# Patient Record
Sex: Female | Born: 1935 | Race: White | Hispanic: No | Smoking: Never smoker
Health system: Southern US, Community
[De-identification: ages and names within clinical notes are randomized; demographics above are authoritative.]

## PROBLEM LIST (undated history)

## (undated) DIAGNOSIS — I2699 Other pulmonary embolism without acute cor pulmonale: Secondary | ICD-10-CM

## (undated) DIAGNOSIS — M1990 Unspecified osteoarthritis, unspecified site: Secondary | ICD-10-CM

## (undated) DIAGNOSIS — K469 Unspecified abdominal hernia without obstruction or gangrene: Secondary | ICD-10-CM

## (undated) DIAGNOSIS — Z853 Personal history of malignant neoplasm of breast: Secondary | ICD-10-CM

## (undated) DIAGNOSIS — I499 Cardiac arrhythmia, unspecified: Secondary | ICD-10-CM

## (undated) DIAGNOSIS — J45909 Unspecified asthma, uncomplicated: Secondary | ICD-10-CM

## (undated) DIAGNOSIS — R011 Cardiac murmur, unspecified: Secondary | ICD-10-CM

## (undated) DIAGNOSIS — I272 Pulmonary hypertension, unspecified: Secondary | ICD-10-CM

## (undated) DIAGNOSIS — I350 Nonrheumatic aortic (valve) stenosis: Secondary | ICD-10-CM

## (undated) DIAGNOSIS — I519 Heart disease, unspecified: Secondary | ICD-10-CM

## (undated) DIAGNOSIS — I454 Nonspecific intraventricular block: Secondary | ICD-10-CM

## (undated) DIAGNOSIS — C50919 Malignant neoplasm of unspecified site of unspecified female breast: Secondary | ICD-10-CM

## (undated) DIAGNOSIS — I10 Essential (primary) hypertension: Secondary | ICD-10-CM

## (undated) HISTORY — DX: Heart disease, unspecified: I51.9

## (undated) HISTORY — DX: Unspecified abdominal hernia without obstruction or gangrene: K46.9

## (undated) HISTORY — DX: Cardiac arrhythmia, unspecified: I49.9

## (undated) HISTORY — DX: Essential (primary) hypertension: I10

## (undated) HISTORY — PX: SPINE SURGERY: SHX786

## (undated) HISTORY — PX: ABDOMINAL HYSTERECTOMY: SHX81

## (undated) HISTORY — DX: Unspecified asthma, uncomplicated: J45.909

## (undated) HISTORY — PX: BREAST BIOPSY: SHX20

## (undated) HISTORY — PX: TONSILLECTOMY: SUR1361

## (undated) HISTORY — PX: HERNIA REPAIR: SHX51

## (undated) HISTORY — PX: DILATION AND CURETTAGE OF UTERUS: SHX78

## (undated) HISTORY — PX: RECTAL SURGERY: SHX760

## (undated) HISTORY — PX: STOMACH SURGERY: SHX791

## (undated) HISTORY — DX: Malignant neoplasm of unspecified site of unspecified female breast: C50.919

## (undated) HISTORY — DX: Other pulmonary embolism without acute cor pulmonale: I26.99

## (undated) HISTORY — DX: Cardiac murmur, unspecified: R01.1

## (undated) HISTORY — DX: Personal history of malignant neoplasm of breast: Z85.3

## (undated) HISTORY — DX: Nonrheumatic aortic (valve) stenosis: I35.0

## (undated) HISTORY — PX: BLADDER SURGERY: SHX569

## (undated) HISTORY — PX: COLONOSCOPY: SHX174

## (undated) HISTORY — PX: ELBOW SURGERY: SHX618

## (undated) HISTORY — PX: OVARY SURGERY: SHX727

## (undated) HISTORY — DX: Pulmonary hypertension, unspecified: I27.20

## (undated) HISTORY — DX: Unspecified osteoarthritis, unspecified site: M19.90

## (undated) HISTORY — DX: Nonspecific intraventricular block: I45.4

---

## 2002-11-12 HISTORY — PX: BREAST SURGERY: SHX581

## 2004-08-12 ENCOUNTER — Ambulatory Visit: Payer: Self-pay | Admitting: Oncology

## 2004-08-21 ENCOUNTER — Ambulatory Visit: Payer: Self-pay | Admitting: Rheumatology

## 2004-11-20 ENCOUNTER — Ambulatory Visit: Payer: Self-pay | Admitting: General Surgery

## 2004-12-08 ENCOUNTER — Ambulatory Visit: Payer: Self-pay | Admitting: Oncology

## 2004-12-13 ENCOUNTER — Ambulatory Visit: Payer: Self-pay | Admitting: Oncology

## 2005-02-08 ENCOUNTER — Ambulatory Visit: Payer: Self-pay | Admitting: Oncology

## 2005-04-11 ENCOUNTER — Ambulatory Visit: Payer: Self-pay | Admitting: Oncology

## 2005-09-26 ENCOUNTER — Ambulatory Visit: Payer: Self-pay | Admitting: Oncology

## 2006-01-14 ENCOUNTER — Ambulatory Visit: Payer: Self-pay | Admitting: General Surgery

## 2006-03-25 ENCOUNTER — Ambulatory Visit: Payer: Self-pay | Admitting: Oncology

## 2006-04-12 ENCOUNTER — Ambulatory Visit: Payer: Self-pay | Admitting: Oncology

## 2006-09-23 ENCOUNTER — Ambulatory Visit: Payer: Self-pay | Admitting: Oncology

## 2007-03-24 ENCOUNTER — Ambulatory Visit: Payer: Self-pay | Admitting: Oncology

## 2007-04-13 ENCOUNTER — Ambulatory Visit: Payer: Self-pay | Admitting: Oncology

## 2007-04-15 ENCOUNTER — Ambulatory Visit: Payer: Self-pay | Admitting: General Surgery

## 2007-10-23 ENCOUNTER — Ambulatory Visit: Payer: Self-pay | Admitting: Oncology

## 2007-11-13 ENCOUNTER — Ambulatory Visit: Payer: Self-pay | Admitting: Oncology

## 2007-11-13 HISTORY — PX: JOINT REPLACEMENT: SHX530

## 2007-11-13 HISTORY — PX: KNEE SURGERY: SHX244

## 2007-12-14 ENCOUNTER — Ambulatory Visit: Payer: Self-pay | Admitting: Oncology

## 2008-04-12 ENCOUNTER — Ambulatory Visit: Payer: Self-pay | Admitting: General Surgery

## 2008-04-12 ENCOUNTER — Ambulatory Visit: Payer: Self-pay | Admitting: Oncology

## 2008-04-29 ENCOUNTER — Ambulatory Visit: Payer: Self-pay | Admitting: Oncology

## 2008-05-12 ENCOUNTER — Ambulatory Visit: Payer: Self-pay | Admitting: Oncology

## 2008-06-12 ENCOUNTER — Ambulatory Visit: Payer: Self-pay | Admitting: Oncology

## 2008-06-16 ENCOUNTER — Ambulatory Visit: Payer: Self-pay | Admitting: Oncology

## 2008-07-13 ENCOUNTER — Ambulatory Visit: Payer: Self-pay | Admitting: Oncology

## 2008-08-14 IMAGING — CT CT CHEST W/O CM
1 series · 16 of 33 positions shown, 20 images · non-contrast
Comparison: No comparison

REASON FOR EXAM: lung nodules   HX of breast CA
COMMENTS:

PROCEDURE:     CT  - CT CHEST WITHOUT CONTRAST  - June 16, 2008 [DATE]
RESULT:     CT Chest
INDICATION: Lung nodules. History of breast cancer.
TECHNIQUE: Multiple axial images of the chest are obtained without
intravenous contrast.

[Series 2: soft tissue · axial · 0.75mm/px · z∈[-434,-139]mm · 16 of 65 slices shown, 20 images]
[im 3/65  mediastinal]
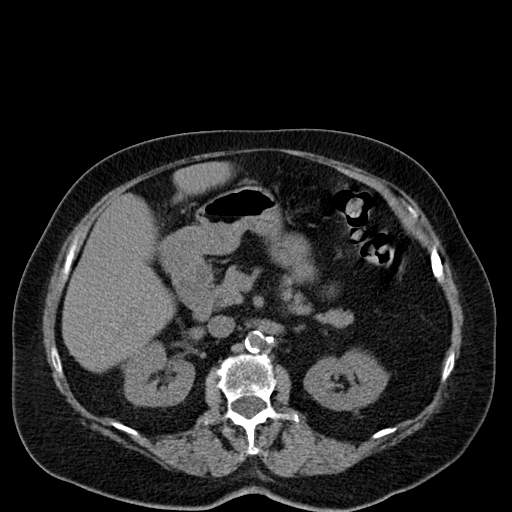
[im 3/65  lung]
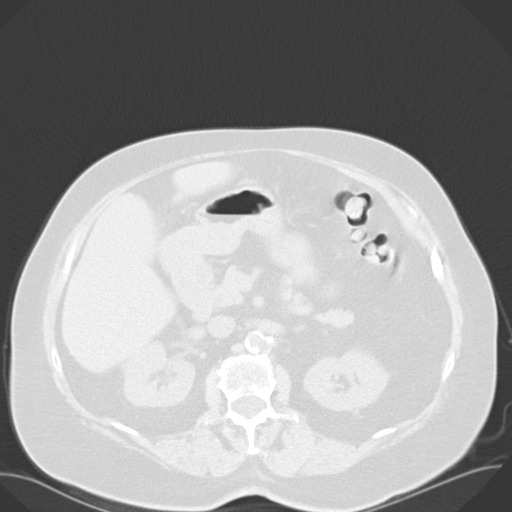
[im 8/65  lung]
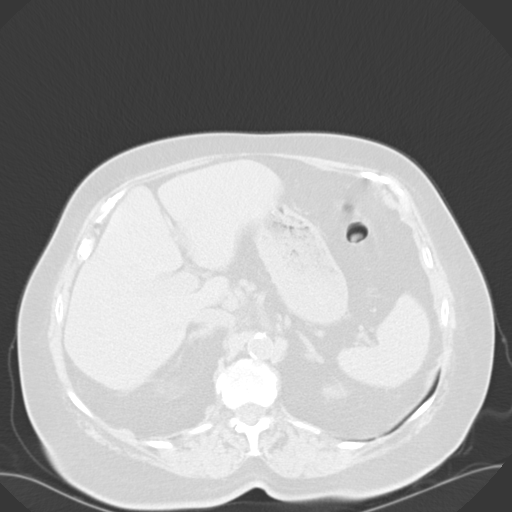
[im 12/65  lung]
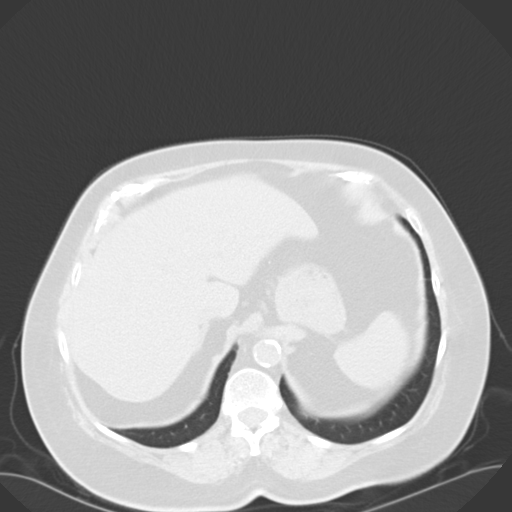
[im 15/65  lung]
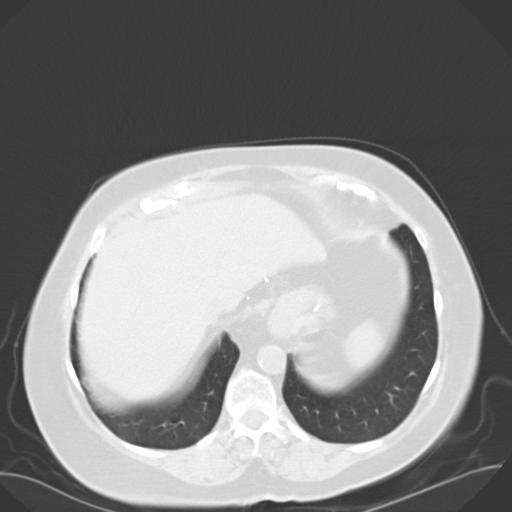
[im 19/65  mediastinal]
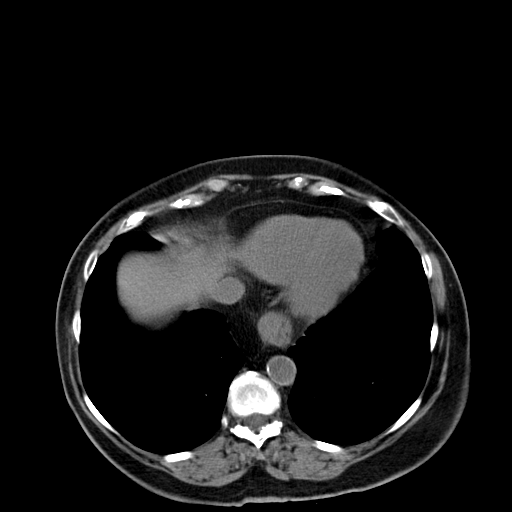
[im 19/65  lung]
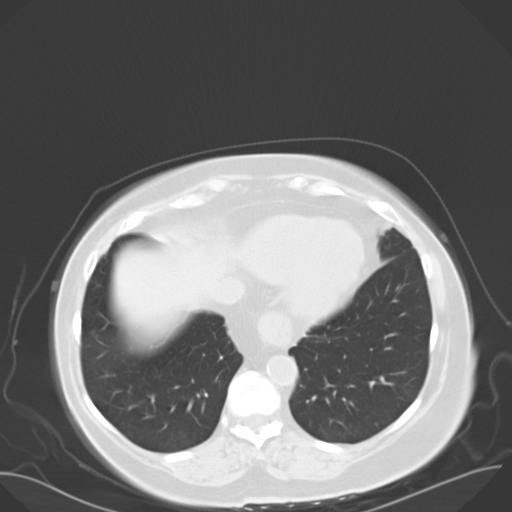
[im 24/65  lung]
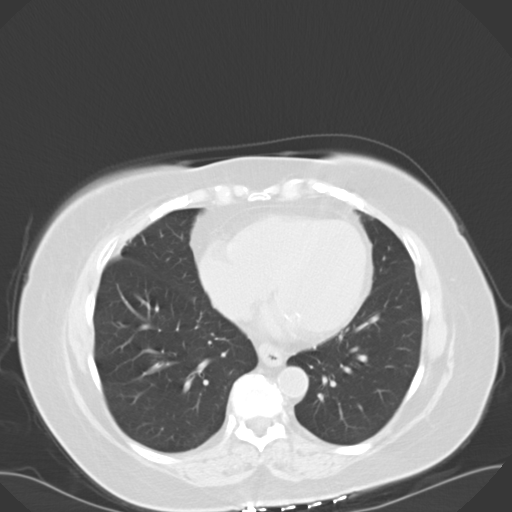
[im 27/65  lung]
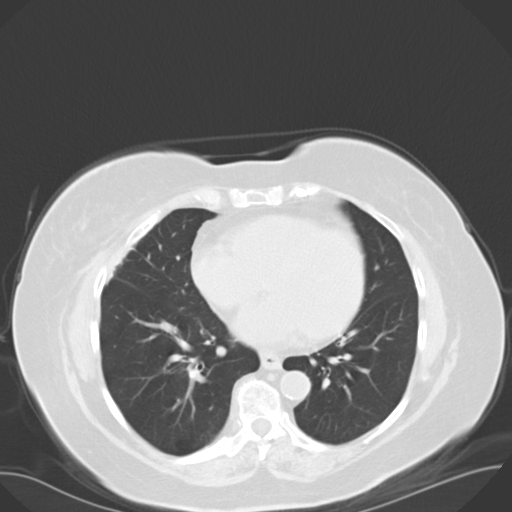
[im 31/65  lung]
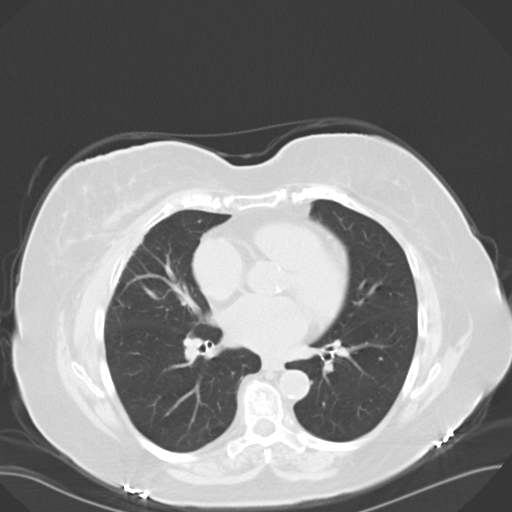
[im 35/65  mediastinal]
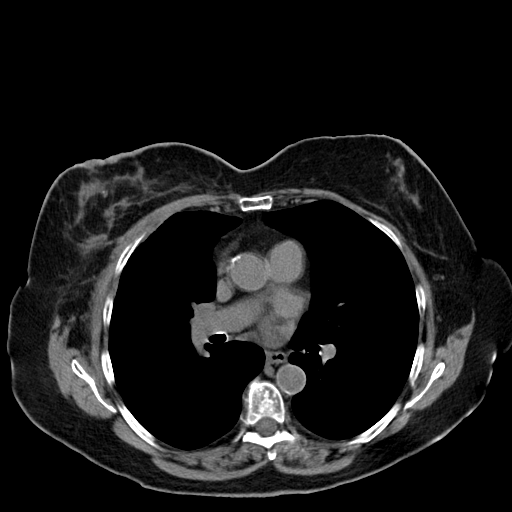
[im 35/65  lung]
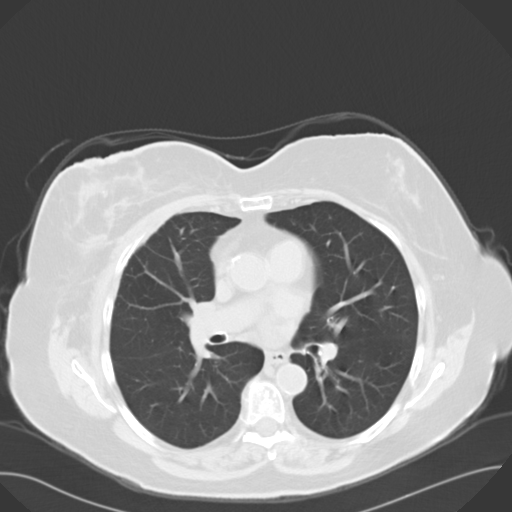
[im 38/65  lung]
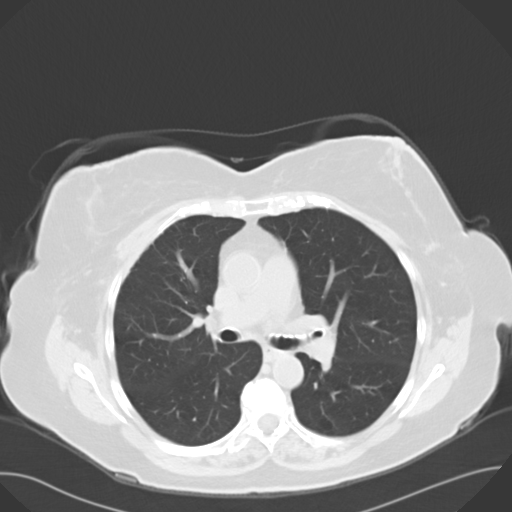
[im 41/65  lung]
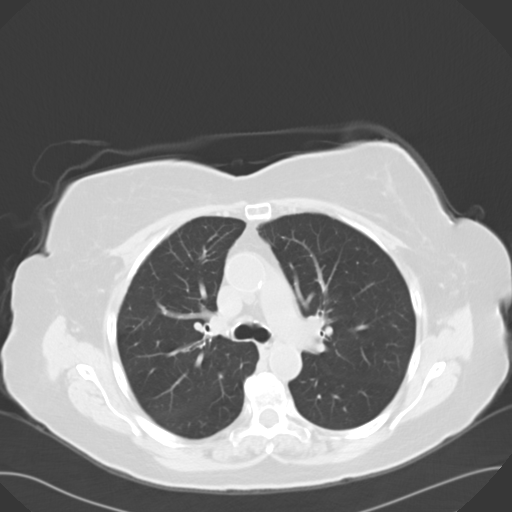
[im 46/65  lung]
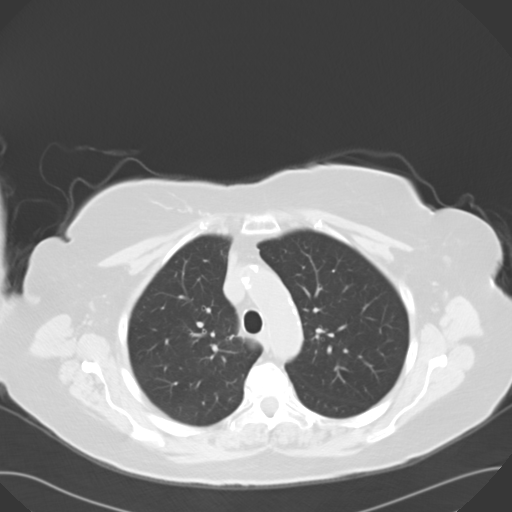
[im 50/65  mediastinal]
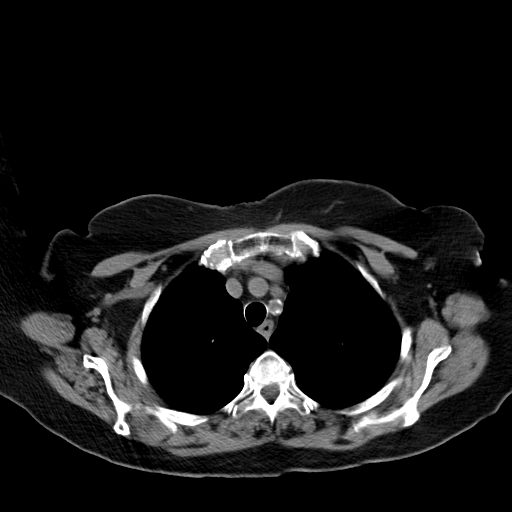
[im 50/65  lung]
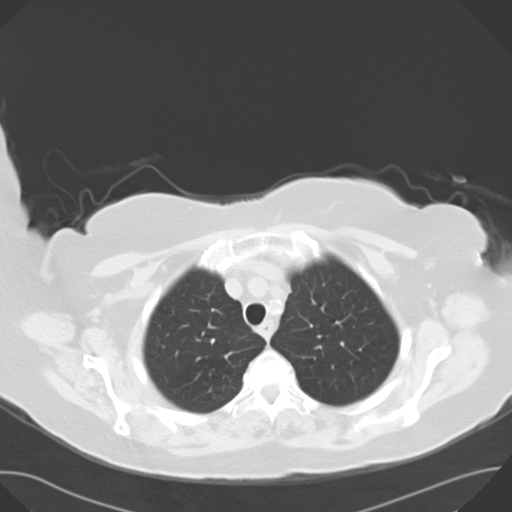
[im 53/65  lung]
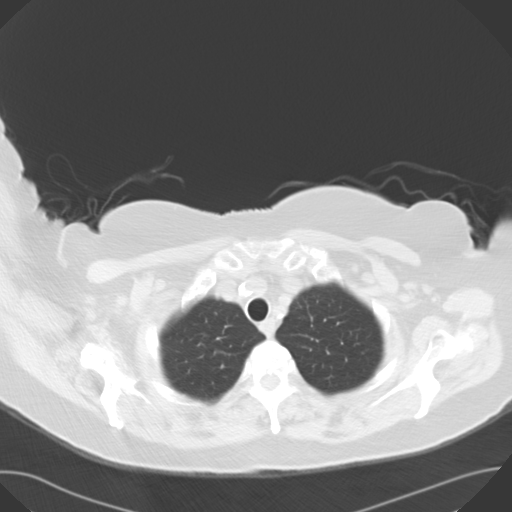
[im 57/65  lung]
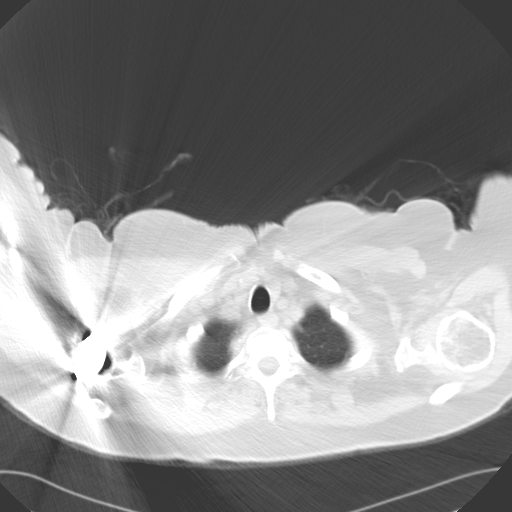
[im 62/65  lung]
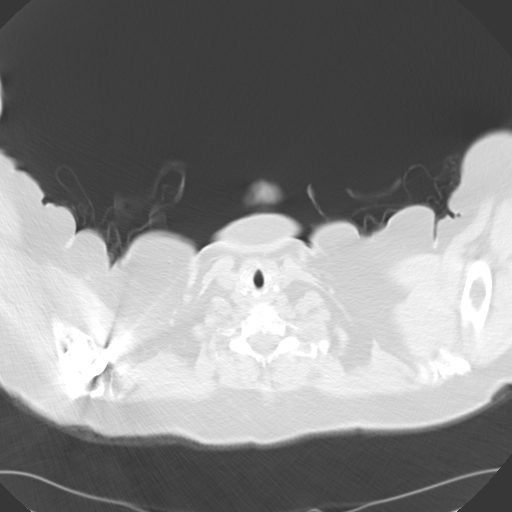

[16 of 33 positions shown; findings below may reference images not displayed]

FINDINGS: Central airways are normal. No pathologically enlarged axillary, hilar, or
mediastinal lymphadenopathy.

No focal consolidation. No pleural effusions. No pneumothorax.

No pericardial effusion. Aorta is normal in caliber.  Coronary artery
calcifications.

Limited images of the thyroid appear normal.

Review of bone windows demonstrates no focal lytic or sclerotic lesions.

Limited noncontrast images of the upper abdomen were obtained. There is a
small axial type hiatal hernia. There surgical clips in the epigastric
region.
IMPRESSION: 1. No acute disease of the chest.

2. Small axial type hiatal hernia.

## 2008-09-12 ENCOUNTER — Ambulatory Visit: Payer: Self-pay | Admitting: Oncology

## 2008-09-24 ENCOUNTER — Ambulatory Visit: Payer: Self-pay | Admitting: Oncology

## 2008-10-12 ENCOUNTER — Ambulatory Visit: Payer: Self-pay | Admitting: Oncology

## 2009-03-12 ENCOUNTER — Ambulatory Visit: Payer: Self-pay | Admitting: Oncology

## 2009-03-23 ENCOUNTER — Ambulatory Visit: Payer: Self-pay | Admitting: Oncology

## 2009-04-12 ENCOUNTER — Ambulatory Visit: Payer: Self-pay | Admitting: Oncology

## 2009-04-13 ENCOUNTER — Ambulatory Visit: Payer: Self-pay | Admitting: General Surgery

## 2009-11-12 DIAGNOSIS — I519 Heart disease, unspecified: Secondary | ICD-10-CM

## 2009-11-12 HISTORY — DX: Heart disease, unspecified: I51.9

## 2009-11-12 HISTORY — PX: OTHER SURGICAL HISTORY: SHX169

## 2010-03-12 ENCOUNTER — Ambulatory Visit: Payer: Self-pay | Admitting: Oncology

## 2010-03-23 ENCOUNTER — Ambulatory Visit: Payer: Self-pay | Admitting: Oncology

## 2010-04-12 ENCOUNTER — Ambulatory Visit: Payer: Self-pay | Admitting: Oncology

## 2010-04-18 ENCOUNTER — Ambulatory Visit: Payer: Self-pay | Admitting: General Surgery

## 2011-04-23 ENCOUNTER — Ambulatory Visit: Payer: Self-pay | Admitting: General Surgery

## 2011-11-13 DIAGNOSIS — I272 Pulmonary hypertension, unspecified: Secondary | ICD-10-CM

## 2011-11-13 HISTORY — DX: Pulmonary hypertension, unspecified: I27.20

## 2012-05-06 ENCOUNTER — Encounter: Payer: Self-pay | Admitting: Internal Medicine

## 2012-05-07 ENCOUNTER — Ambulatory Visit: Payer: Self-pay | Admitting: General Surgery

## 2012-05-12 ENCOUNTER — Encounter: Payer: Self-pay | Admitting: Internal Medicine

## 2012-06-12 ENCOUNTER — Encounter: Payer: Self-pay | Admitting: Internal Medicine

## 2012-07-13 ENCOUNTER — Encounter: Payer: Self-pay | Admitting: Internal Medicine

## 2012-08-12 ENCOUNTER — Encounter: Payer: Self-pay | Admitting: Internal Medicine

## 2013-03-03 ENCOUNTER — Ambulatory Visit: Payer: Self-pay | Admitting: Internal Medicine

## 2013-03-20 ENCOUNTER — Encounter: Payer: Self-pay | Admitting: General Surgery

## 2013-05-01 IMAGING — CR DG CHEST 2V
1 series · 2 of 2 positions shown · non-contrast
Comparison: none

REASON FOR EXAM: discomfort rt side rib pain
COMMENTS:

[Series 1: pa · 0.17mm/px · 2 of 2 slices shown]
[im 1/2]
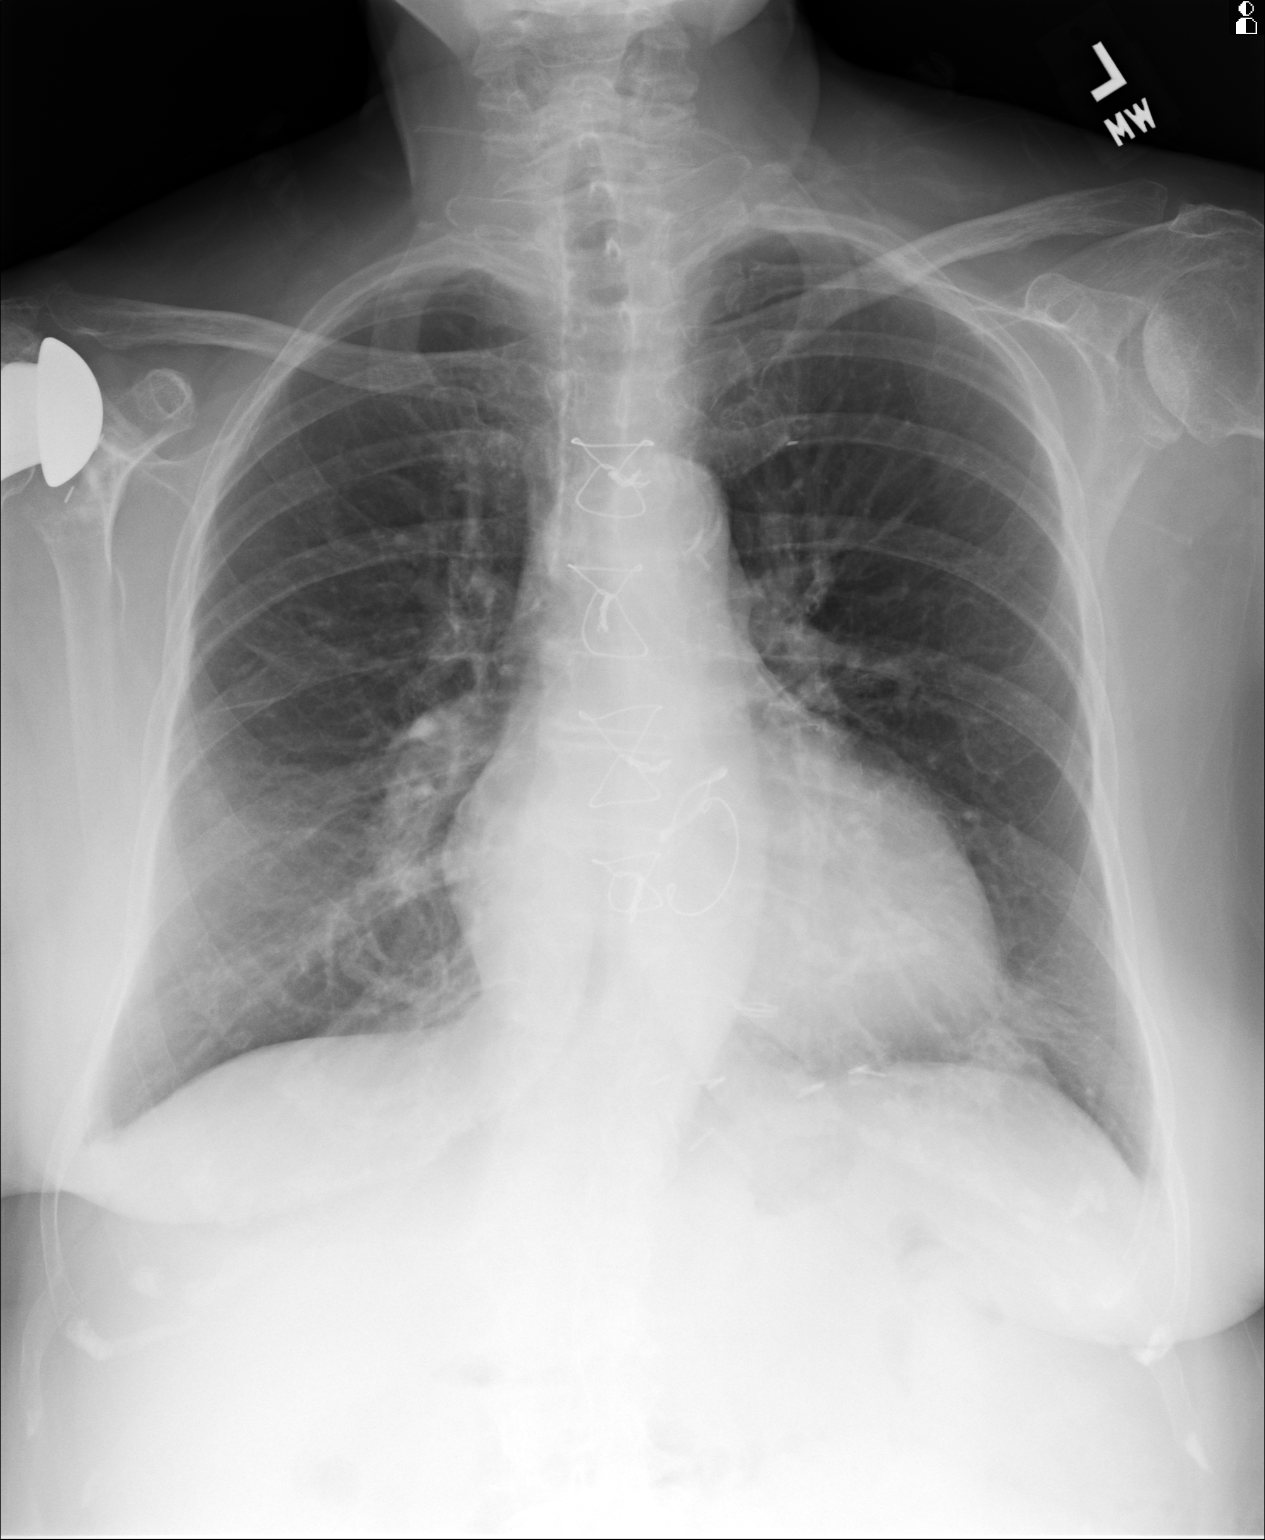
[im 2/2]
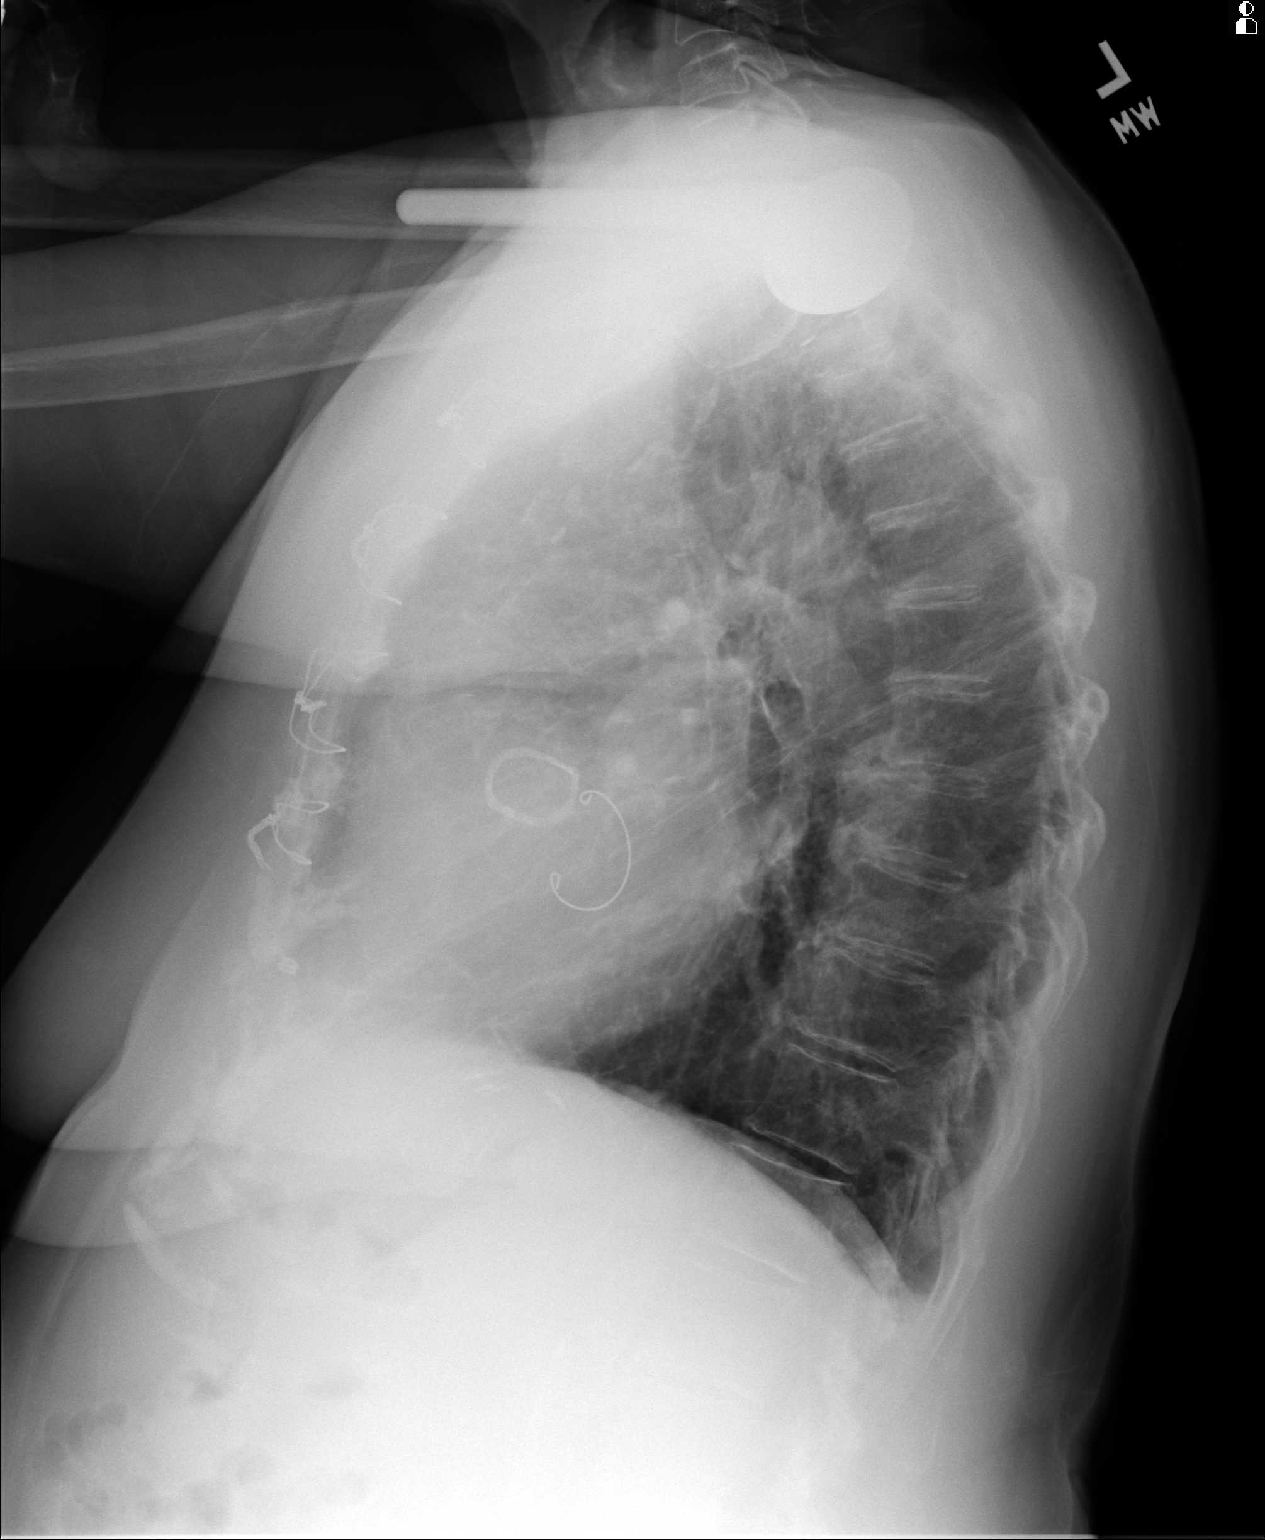

[2 of 2 positions shown; findings below may reference images not displayed]

PROCEDURE:     RAETZ - RAETZ CHEST PA (OR AP) AND LAT  - March 03, 2013  [DATE]

RESULT:     The frontal view shows patchy density at the right lung base
which could represent pleural thickening, atelectasis or early infiltrate.
Is not well seen on the lateral view. Prosthetic valve is present.
Sternotomy wires are present. Is no effusion, definite edema or
pneumothorax. Right shoulder hemiarthroplasty changes are present.
IMPRESSION: Postoperative changes are noted. The possibility some
pleural thickening or minimal atelectasis of the right lung base is not
excluded.

[REDACTED]

## 2013-05-12 ENCOUNTER — Encounter: Payer: Self-pay | Admitting: General Surgery

## 2013-05-12 ENCOUNTER — Ambulatory Visit: Payer: Self-pay | Admitting: General Surgery

## 2013-06-09 ENCOUNTER — Encounter: Payer: Self-pay | Admitting: General Surgery

## 2013-06-09 ENCOUNTER — Ambulatory Visit (INDEPENDENT_AMBULATORY_CARE_PROVIDER_SITE_OTHER): Payer: Medicare Other | Admitting: General Surgery

## 2013-06-09 VITALS — BP 148/58 | HR 64 | Resp 16 | Ht 64.0 in | Wt 166.0 lb

## 2013-06-09 DIAGNOSIS — Z853 Personal history of malignant neoplasm of breast: Secondary | ICD-10-CM | POA: Insufficient documentation

## 2013-06-09 NOTE — Patient Instructions (Addendum)
Continue self breast exams. Call office for any new breast issues or concerns.  Bilateral diagnostic mammogram in 1 year.  

## 2013-06-09 NOTE — Progress Notes (Signed)
Patient ID: Shelley Meyers, female   DOB: [birth year], 79 y.o.   MRN: [id number]  Chief Complaint  Patient presents with  . Follow-up    mammogram    HPI Shelley Meyers is a 79 y.o. female who presents for her annual follow up mammogram with a known history of breast cancer. The most recent mammogram was done on 05-12-13.  Patient does perform regular self breast checks and gets regular mammograms done.  No new breast issues. Her last stress test was done April 2014.  HPI  Past Medical History  Diagnosis Date  . Heart disease 2011  . Hypertension   . Asthma   . Heart murmur   . Aortic stenosis   . Hernia     hiatal  . Arthritis   . Personal history of malignant neoplasm of breast   . Sleep related cardiac arrhythmias   . Pulmonary embolism   . Pulmonary hypertension 2013  . Bundle branch block   . Breast cancer   . Murmur     Past Surgical History  Procedure Laterality Date  . Knee surgery Left 2009  . Joint replacement Left 2009    shoulder  . Hernia repair    . Tonsillectomy    . Abdominal hysterectomy    . Ovary surgery    . Spine surgery      spinal fusion  . Bladder surgery    . Stomach surgery    . Rectal surgery    . Elbow surgery    . Dilation and curettage of uterus    . Colonoscopy    . Open heart surgery   2011    aortic valve and mitral valve, bypass, and left atrium surgery   . Breast biopsy    . Breast surgery Right 2004    lumpectomy due to Pagets Disease    Family History  Problem Relation Age of Onset  . Colon cancer Father     Social History History  Substance Use Topics  . Smoking status: Never Smoker   . Smokeless tobacco: Never Used  . Alcohol Use: No    Allergies  Allergen Reactions  . Ivp Dye (Iodinated Diagnostic Agents) Shortness Of Breath  . Shellfish Allergy Shortness Of Breath  . Betadine (Povidone Iodine)     blisters  . Other     Clinaril  . Toradol (Ketorolac Tromethamine)   . Celebrex (Celecoxib) Rash  . Neurontin  (Gabapentin) Palpitations  . Penicillins Rash  . Reglan (Metoclopramide) Palpitations    Current Outpatient Prescriptions  Medication Sig Dispense Refill  . acetaminophen (TYLENOL) 325 MG tablet Take 650 mg by mouth every 6 (six) hours as needed for pain.      Marland Kitchen aspirin 81 MG tablet Take 81 mg by mouth daily.      . chlorthalidone (HYGROTON) 25 MG tablet       . cholecalciferol (VITAMIN D) 1000 UNITS tablet Take 1,000 Units by mouth daily.      . enalapril (VASOTEC) 20 MG tablet Take 10 mg by mouth daily.       . furosemide (LASIX) 20 MG tablet Take 20 mg by mouth 2 (two) times daily.      . isosorbide mononitrate (IMDUR) 60 MG 24 hr tablet       . levothyroxine (SYNTHROID, LEVOTHROID) 25 MCG tablet Take 25 mcg by mouth daily before breakfast.       . metFORMIN (GLUCOPHAGE) 500 MG tablet Take 500 mg by mouth 2 (two) times daily with  a meal.       . metoprolol (TOPROL-XL) 200 MG 24 hr tablet Take 200 mg by mouth daily.       . metroNIDAZOLE (FLAGYL) 500 MG tablet Take 250 mg by mouth 3 (three) times daily.       Marland Kitchen NEXIUM 40 MG capsule Take 40 mg by mouth daily before breakfast.       . NITROSTAT 0.4 MG SL tablet Place under the tongue every 5 (five) minutes as needed for chest pain.       . simvastatin (ZOCOR) 10 MG tablet Take 10 mg by mouth at bedtime.       Marland Kitchen spironolactone (ALDACTONE) 25 MG tablet       . sucralfate (CARAFATE) 1 G tablet Take 1 g by mouth 2 (two) times daily.       No current facility-administered medications for this visit.    Review of Systems Review of Systems  Constitutional: Negative.   Respiratory: Positive for shortness of breath.   Cardiovascular: Negative.     Blood pressure 148/58, pulse 64, resp. rate 16, height 5\' 4"  (1.626 m), weight 166 lb (75.297 kg).  Physical Exam Physical Exam  Constitutional: She appears well-developed and well-nourished.  Eyes: Conjunctivae are normal.  Neck: No mass and no thyromegaly present.  Cardiovascular: Normal  rate and regular rhythm.   Murmur heard.  Systolic murmur is present with a grade of 4/6  Pulses:      Dorsalis pedis pulses are 2+ on the right side, and 2+ on the left side.       Posterior tibial pulses are 2+ on the right side, and 2+ on the left side.  Mild bilateral lower leg edema    Pulmonary/Chest: Effort normal and breath sounds normal. Right breast exhibits no mass, no nipple discharge, no skin change and no tenderness. Left breast exhibits no inverted nipple, no mass, no nipple discharge, no skin change and no tenderness.  Nipple area complex absent from previous right lumpectomy  Lymphadenopathy:    She has no cervical adenopathy.    She has no axillary adenopathy.  Neurological: She is alert.  Skin: Skin is warm and dry.  Psychiatric: She has a normal mood and affect.    Data Reviewed Mammogram reviewed.  Assessment    Stable exam.    Plan    Bilateral diagnostic mammogram in 1 year.       SANKAR,SEEPLAPUTHUR G 06/10/2013, 6:38 AM

## 2013-06-10 ENCOUNTER — Encounter: Payer: Self-pay | Admitting: General Surgery

## 2013-06-10 ENCOUNTER — Telehealth: Payer: Self-pay | Admitting: *Deleted

## 2013-06-10 NOTE — Telephone Encounter (Signed)
Patient called reporting that she sees Dr. Dewaine Oats for primary care. This will be updated in EP[facility]. Also, she reports she takes carafate 2 teaspoons twice daily as needed. Medication list will be updated.

## 2013-09-09 ENCOUNTER — Ambulatory Visit: Payer: Self-pay | Admitting: Ophthalmology

## 2013-09-09 LAB — POTASSIUM: Potassium: 4 mmol/L (ref 3.5–5.1)

## 2013-09-21 ENCOUNTER — Ambulatory Visit: Payer: Self-pay | Admitting: Ophthalmology

## 2013-11-12 DEATH — deceased

## 2014-09-13 ENCOUNTER — Encounter: Payer: Self-pay | Admitting: General Surgery

## 2015-03-04 NOTE — Op Note (Signed)
PATIENT NAME:  OCEAN, KEARLEY MR#:  [id number] DATE OF BIRTH:  09-Jan-1928  DATE OF PROCEDURE:  09/21/2013  PREOPERATIVE DIAGNOSIS: Cataract, left eye.   POSTOPERATIVE DIAGNOSIS: Cataract, left eye.   PROCEDURE PERFORMED: Extracapsular cataract extraction using phacoemulsification with placement Alcon [id number], 20.5 diopter posterior chamber lens, serial number [id number].   SURGEON: Loura Back. Hrishikesh Hoeg, M.D.   ANESTHESIA: 4% lidocaine and 0.75% Marcaine a 50-50 mixture with 10 units/mL of HyoMax added, given as a peribulbar.   ANESTHESIOLOGIST: Dr. Jonna Coup.   COMPLICATIONS: None.   ESTIMATED BLOOD LOSS: Less than 1 mL.   DESCRIPTION OF PROCEDURE: The patient was brought to the Operating Room and given a peribulbar block and IV sedation. She was prepped and draped in the usual fashion. The vertical rectus muscles were imbricated using 5-0 silk sutures, bridle sutures. The limbus was cauterized superiorly for one clock hour and a partial-thickness scleral groove was made at the posterior surgical limbus.   DICTATION ENDS HERE     ____________________________ Loura Back. Corney Knighton, MD sad:sg D: 09/21/2013 13:05:59 ET T: 09/21/2013 13:48:00 ET JOB#: [id number]  cc: Remo Lipps A. Tremond Shimabukuro, MD, <Dictator> Martie Lee MD ELECTRONICALLY SIGNED 09/28/2013 13:51

## 2015-03-04 NOTE — Op Note (Signed)
PATIENT NAME:  Shelley Meyers, Shelley Meyers MR#:  [id number] DATE OF BIRTH:  09-26-1928  DATE OF PROCEDURE:  09/21/2013   PREOPERATIVE DIAGNOSIS: Cataract, left eye.   POSTOPERATIVE DIAGNOSIS: Cataract, left eye.   PROCEDURE PERFORMED: Extracapsular cataract extraction using phacoemulsification with placement of Alcon [id number], 20.5-diopter posterior chamber lens, serial number [id number].   ANESTHESIA: 4% lidocaine and 0.75% Marcaine, a 50-50 mixture with 10 units/mL of Hylenex added given as peribulbar.   SURGEON:  Estill Cotta, M.D.  ANESTHESIOLOGIST: Dr. Myra Gianotti.   COMPLICATIONS: None.   ESTIMATED BLOOD LOSS: Less than 1 mL.   DESCRIPTION OF PROCEDURE: The patient was brought to the operating room and given IV sedation and peribulbar block. Due to a SEVERE ALLERGY TO IODINE, she was prepped with pHisoDerm. She was then draped in the usual fashion. 5-0 silk sutures were used to imbricate the superior and inferior rectus as bridle sutures. The limbus was cauterized for 1 clock hour superiorly and a partial-thickness scleral groove was made there. This was dissected anteriorly into clear cornea with an Alcon crescent knife. The anterior chamber was entered superotemporally through clear cornea with a paracentesis knife and through the lamellar dissection with a 3.6 mm keratome. DisCoVisc was used to replace the aqueous and continuous tear circular capsulorrhexis carried out.  Hydrodissection was used to loosen the nucleus. Phacoemulsification was carried out in a divide and conquer technique. Ultrasound time was 2 minutes and 59 seconds, average power of 26.5%. CDE is 69.60. Irrigation and aspiration was used to remove the residual cortex. The capsular bag was inflated with DisCoVisc, and the intraocular lens was inserted in the bag using a [employer]. Irrigation and aspiration was used to remove the residual DisCoVisc. The wound was inflated with balanced salt and Miostat was injected through the  paracentesis tract. 0.10 mL of cefuroxime containing 1 mg of the drug was injected through the paracentesis tract.  The wound was checked for leaks. None were found. The bridle sutures were removed and 2 drops of Vigamox were placed in the eye. A shield was placed on the eye, and the patient was discharged to the recovery room in good condition.    ____________________________ Loura Back Sarh Kirschenbaum, MD sad:dmm D: 09/21/2013 13:08:39 ET T: 09/21/2013 13:25:00 ET JOB#: [id number]  cc: Remo Lipps A. Zi Sek, MD, <Dictator> Martie Lee MD ELECTRONICALLY SIGNED 09/28/2013 13:50
